# Patient Record
Sex: Female | Born: 1975 | Race: White | Hispanic: No | Marital: Married | Smoking: Current every day smoker
Health system: Southern US, Community
[De-identification: ages and names within clinical notes are randomized; demographics above are authoritative.]

---

## 2016-12-15 ENCOUNTER — Emergency Department (HOSPITAL_BASED_OUTPATIENT_CLINIC_OR_DEPARTMENT_OTHER)
Admission: EM | Admit: 2016-12-15 | Discharge: 2016-12-15 | Disposition: A | Discharge status: Home / Self Care | Payer: BLUE CROSS/BLUE SHIELD | Attending: Emergency Medicine | Admitting: Emergency Medicine

## 2016-12-15 ENCOUNTER — Emergency Department (HOSPITAL_BASED_OUTPATIENT_CLINIC_OR_DEPARTMENT_OTHER): Payer: BLUE CROSS/BLUE SHIELD

## 2016-12-15 ENCOUNTER — Encounter (HOSPITAL_BASED_OUTPATIENT_CLINIC_OR_DEPARTMENT_OTHER): Payer: Self-pay | Admitting: *Deleted

## 2016-12-15 ENCOUNTER — Encounter (HOSPITAL_BASED_OUTPATIENT_CLINIC_OR_DEPARTMENT_OTHER): Payer: Self-pay

## 2016-12-15 ENCOUNTER — Emergency Department (HOSPITAL_BASED_OUTPATIENT_CLINIC_OR_DEPARTMENT_OTHER)
Admission: EM | Admit: 2016-12-15 | Discharge: 2016-12-15 | Disposition: A | Discharge status: Home / Self Care | Payer: BLUE CROSS/BLUE SHIELD | Source: Home / Self Care | Attending: Emergency Medicine | Admitting: Emergency Medicine

## 2016-12-15 DIAGNOSIS — Z87891 Personal history of nicotine dependence: Secondary | ICD-10-CM | POA: Insufficient documentation

## 2016-12-15 DIAGNOSIS — R112 Nausea with vomiting, unspecified: Secondary | ICD-10-CM

## 2016-12-15 DIAGNOSIS — R1012 Left upper quadrant pain: Secondary | ICD-10-CM | POA: Insufficient documentation

## 2016-12-15 DIAGNOSIS — R1032 Left lower quadrant pain: Secondary | ICD-10-CM

## 2016-12-15 DIAGNOSIS — F17200 Nicotine dependence, unspecified, uncomplicated: Secondary | ICD-10-CM | POA: Insufficient documentation

## 2016-12-15 DIAGNOSIS — R109 Unspecified abdominal pain: Secondary | ICD-10-CM

## 2016-12-15 LAB — URINALYSIS, ROUTINE W REFLEX MICROSCOPIC
BILIRUBIN URINE: NEGATIVE
Bilirubin Urine: NEGATIVE
GLUCOSE, UA: NEGATIVE mg/dL
Glucose, UA: NEGATIVE mg/dL
KETONES UR: 40 mg/dL — AB
Ketones, ur: 40 mg/dL — AB
Leukocytes, UA: NEGATIVE
Leukocytes, UA: NEGATIVE
NITRITE: NEGATIVE
Nitrite: NEGATIVE
PROTEIN: NEGATIVE mg/dL
Protein, ur: NEGATIVE mg/dL
SPECIFIC GRAVITY, URINE: 1.006 (ref 1.005–1.030)
Specific Gravity, Urine: 1.021 (ref 1.005–1.030)
pH: 6.5 (ref 5.0–8.0)
pH: 6.5 (ref 5.0–8.0)

## 2016-12-15 LAB — CBC WITH DIFFERENTIAL/PLATELET
BASOS PCT: 0 %
Basophils Absolute: 0 10*3/uL (ref 0.0–0.1)
Basophils Absolute: 0.1 10*3/uL (ref 0.0–0.1)
Basophils Relative: 1 %
EOS ABS: 0.1 10*3/uL (ref 0.0–0.7)
Eosinophils Absolute: 0 10*3/uL (ref 0.0–0.7)
Eosinophils Relative: 0 %
Eosinophils Relative: 1 %
HEMATOCRIT: 38.7 % (ref 36.0–46.0)
HEMATOCRIT: 43.2 % (ref 36.0–46.0)
HEMOGLOBIN: 13.7 g/dL (ref 12.0–15.0)
HEMOGLOBIN: 15.2 g/dL — AB (ref 12.0–15.0)
LYMPHS ABS: 2.5 10*3/uL (ref 0.7–4.0)
LYMPHS ABS: 3.2 10*3/uL (ref 0.7–4.0)
LYMPHS PCT: 19 %
LYMPHS PCT: 29 %
MCH: 33.3 pg (ref 26.0–34.0)
MCH: 33.3 pg (ref 26.0–34.0)
MCHC: 35.2 g/dL (ref 30.0–36.0)
MCHC: 35.4 g/dL (ref 30.0–36.0)
MCV: 93.9 fL (ref 78.0–100.0)
MCV: 94.5 fL (ref 78.0–100.0)
MONOS PCT: 9 %
MONOS PCT: 9 %
Monocytes Absolute: 1 10*3/uL (ref 0.1–1.0)
Monocytes Absolute: 1.2 10*3/uL — ABNORMAL HIGH (ref 0.1–1.0)
NEUTROS PCT: 60 %
Neutro Abs: 6.6 10*3/uL (ref 1.7–7.7)
Neutro Abs: 9.5 10*3/uL — ABNORMAL HIGH (ref 1.7–7.7)
Neutrophils Relative %: 72 %
Platelets: 329 10*3/uL (ref 150–400)
Platelets: 367 10*3/uL (ref 150–400)
RBC: 4.12 MIL/uL (ref 3.87–5.11)
RBC: 4.57 MIL/uL (ref 3.87–5.11)
RDW: 11.6 % (ref 11.5–15.5)
RDW: 11.8 % (ref 11.5–15.5)
WBC: 10.9 10*3/uL — ABNORMAL HIGH (ref 4.0–10.5)
WBC: 13.4 10*3/uL — ABNORMAL HIGH (ref 4.0–10.5)

## 2016-12-15 LAB — COMPREHENSIVE METABOLIC PANEL
ALBUMIN: 4.4 g/dL (ref 3.5–5.0)
ALBUMIN: 4.8 g/dL (ref 3.5–5.0)
ALK PHOS: 77 U/L (ref 38–126)
ALK PHOS: 91 U/L (ref 38–126)
ALT: 20 U/L (ref 14–54)
ALT: 23 U/L (ref 14–54)
ANION GAP: 13 (ref 5–15)
ANION GAP: 14 (ref 5–15)
AST: 22 U/L (ref 15–41)
AST: 25 U/L (ref 15–41)
BILIRUBIN TOTAL: 1.1 mg/dL (ref 0.3–1.2)
BUN: 12 mg/dL (ref 6–20)
BUN: 8 mg/dL (ref 6–20)
CALCIUM: 9.2 mg/dL (ref 8.9–10.3)
CALCIUM: 9.9 mg/dL (ref 8.9–10.3)
CHLORIDE: 101 mmol/L (ref 101–111)
CO2: 21 mmol/L — AB (ref 22–32)
CO2: 24 mmol/L (ref 22–32)
Chloride: 99 mmol/L — ABNORMAL LOW (ref 101–111)
Creatinine, Ser: 0.71 mg/dL (ref 0.44–1.00)
Creatinine, Ser: 0.77 mg/dL (ref 0.44–1.00)
GFR calc Af Amer: >60 mL/min (ref >60)
GFR calc Af Amer: >60 mL/min (ref >60)
GFR calc non Af Amer: >60 mL/min (ref >60)
GFR calc non Af Amer: >60 mL/min (ref >60)
GLUCOSE: 128 mg/dL — AB (ref 65–99)
GLUCOSE: 95 mg/dL (ref 65–99)
Potassium: 3.2 mmol/L — ABNORMAL LOW (ref 3.5–5.1)
Potassium: 3.2 mmol/L — ABNORMAL LOW (ref 3.5–5.1)
SODIUM: 136 mmol/L (ref 135–145)
Sodium: 136 mmol/L (ref 135–145)
TOTAL PROTEIN: 8.6 g/dL — AB (ref 6.5–8.1)
Total Bilirubin: 1 mg/dL (ref 0.3–1.2)
Total Protein: 7.7 g/dL (ref 6.5–8.1)

## 2016-12-15 LAB — URINALYSIS, MICROSCOPIC (REFLEX)

## 2016-12-15 LAB — PREGNANCY, URINE: Preg Test, Ur: NEGATIVE

## 2016-12-15 LAB — LIPASE, BLOOD: Lipase: 33 U/L (ref 11–51)

## 2016-12-15 MED ORDER — ONDANSETRON HCL 4 MG/2ML IJ SOLN
4.0000 mg | Freq: Once | INTRAMUSCULAR | Status: AC
  Administered 2016-12-15: 4 mg via INTRAVENOUS
  Filled 2016-12-15: qty 2

## 2016-12-15 MED ORDER — SODIUM CHLORIDE 0.9 % IV BOLUS (SEPSIS)
1000.0000 mL | Freq: Once | INTRAVENOUS | Status: AC
  Administered 2016-12-15: 1000 mL via INTRAVENOUS

## 2016-12-15 MED ORDER — SODIUM CHLORIDE 0.9 % IV SOLN
INTRAVENOUS | Status: DC
  Administered 2016-12-15: 19:00:00 via INTRAVENOUS

## 2016-12-15 MED ORDER — ONDANSETRON 8 MG PO TBDP: 8.0000 mg | ORAL_TABLET | Freq: Three times a day (TID) | ORAL | 0 refills | Status: AC | PRN

## 2016-12-15 MED ORDER — ONDANSETRON 4 MG PO TBDP
ORAL_TABLET | ORAL | Status: AC
Start: 2016-12-15 — End: 2016-12-15
  Administered 2016-12-15: 4 mg via ORAL
  Filled 2016-12-15: qty 1

## 2016-12-15 MED ORDER — HYDROCODONE-ACETAMINOPHEN 5-325 MG PO TABS: 1.0000 | ORAL_TABLET | ORAL | 0 refills | Status: AC | PRN

## 2016-12-15 MED ORDER — FENTANYL CITRATE (PF) 100 MCG/2ML IJ SOLN
50.0000 ug | Freq: Once | INTRAMUSCULAR | Status: AC
  Administered 2016-12-15: 50 ug via INTRAVENOUS
  Filled 2016-12-15: qty 2

## 2016-12-15 MED ORDER — HYDROMORPHONE HCL 1 MG/ML IJ SOLN
0.5000 mg | INTRAMUSCULAR | Status: DC | PRN
  Administered 2016-12-15: 0.5 mg via INTRAVENOUS
  Filled 2016-12-15: qty 1

## 2016-12-15 MED ORDER — ONDANSETRON 4 MG PO TBDP
4.0000 mg | ORAL_TABLET | Freq: Once | ORAL | Status: AC
  Administered 2016-12-15: 4 mg via ORAL

## 2016-12-15 MED ORDER — DICYCLOMINE HCL 20 MG PO TABS: 20.0000 mg | ORAL_TABLET | Freq: Four times a day (QID) | ORAL | 0 refills | Status: AC | PRN

## 2016-12-15 NOTE — ED Provider Notes (Signed)
[facility] Provider Note   CSN: [id number] Arrival date & time: 12/15/16  1729     History   Chief Complaint Chief Complaint  Patient presents with  . Back Pain    HPI Malasha Kleppe is a 41 y.o. female.  HPI Patient presents to the emergency room for evaluation of persistent nausea, vomiting, dry heaves left flank pain. States his symptoms started several days ago where she was vomiting several times. Over the last couple of days she has not been vomiting but has been primarily dry heaving. She's had some pain in the left flank area. It seems to get better when her husband massage her back. She cannot find a comfortable position. She denies any trouble with shortness of breath. No coughing. When she gets the pain is intense and severe. Last night she was seen in the emergency room.  She had laboratory tests and a CT scan. She was released at about 2 AM. Patient was given prescriptions for anti-spasmodics and something for nausea. Patient states she has not been able to swallow a pill and has not taken anything for pain. She came into the emergency room today because she is having this persistent severe back pain. History reviewed. No pertinent past medical history.  There are no active problems to display for this patient.   History reviewed. No pertinent surgical history.  OB History    No data available       Home Medications    Prior to Admission medications   Medication Sig Start Date End Date Taking? Authorizing Provider  dicyclomine (BENTYL) 20 MG tablet Take 1 tablet (20 mg total) by mouth 4 (four) times daily as needed (for abdominal cramping). 12/15/16   Molpus, John, MD  HYDROcodone-acetaminophen (NORCO/VICODIN) 5-325 MG tablet Take 1 tablet by mouth every 4 (four) hours as needed. 12/15/16   Linwood Dibbles, MD  ondansetron (ZOFRAN ODT) 8 MG disintegrating tablet Take 1 tablet (8 mg total) by mouth every 8 (eight) hours as needed for nausea or vomiting. 12/15/16    Molpus, Jonny Ruiz, MD    Family History History reviewed. No pertinent family history.  Social History Social History  Substance Use Topics  . Smoking status: Current Every Day Smoker  . Smokeless tobacco: Never Used  . Alcohol use Yes     Allergies   Penicillins   Review of Systems Review of Systems  Constitutional: Negative for fever.  Cardiovascular: Negative for chest pain.  Genitourinary: Negative for dysuria.  All other systems reviewed and are negative.    Physical Exam Updated Vital Signs BP (!) 173/100 (BP Location: Left Arm)   Pulse 82   Temp 98.3 F (36.8 C) (Oral)   Resp 18   Ht 1.499 m (4\' 11" )   Wt 56.7 kg (125 lb)   LMP 12/10/2016 Comment: (-) u pregnancy (-)//a.c.  SpO2 100%   BMI 25.25 kg/m   Physical Exam  Constitutional: She appears well-developed and well-nourished. No distress.  HENT:  Head: Normocephalic and atraumatic.  Right Ear: External ear normal.  Left Ear: External ear normal.  Eyes: Conjunctivae are normal. Right eye exhibits no discharge. Left eye exhibits no discharge. No scleral icterus.  Neck: Neck supple. No tracheal deviation present.  Cardiovascular: Normal rate, regular rhythm and intact distal pulses.   Pulmonary/Chest: Effort normal and breath sounds normal. No stridor. No respiratory distress. She has no wheezes. She has no rales.  Abdominal: Soft. Bowel sounds are normal. She exhibits no distension and no mass.  There is no tenderness. There is no rebound and no guarding. No hernia.  No abdominal tenderness, no CVA tenderness  Musculoskeletal: She exhibits no edema or tenderness.  No spinal process edema or tenderness  Neurological: She is alert. She has normal strength. No cranial nerve deficit (no facial droop, extraocular movements intact, no slurred speech) or sensory deficit. She exhibits normal muscle tone. She displays no seizure activity. Coordination normal.  Skin: Skin is warm and dry. No rash noted.  No  vesicular rash noted in the flank area  Psychiatric: She has a normal mood and affect.  Nursing note and vitals reviewed.    ED Treatments / Results  Labs (all labs ordered are listed, but only abnormal results are displayed) Labs Reviewed  URINALYSIS, ROUTINE W REFLEX MICROSCOPIC - Abnormal; Notable for the following:       Result Value   Hgb urine dipstick SMALL (*)    Ketones, ur 40 (*)    All other components within normal limits  COMPREHENSIVE METABOLIC PANEL - Abnormal; Notable for the following:    Potassium 3.2 (*)    CO2 21 (*)    All other components within normal limits  CBC WITH DIFFERENTIAL/PLATELET - Abnormal; Notable for the following:    WBC 10.9 (*)    All other components within normal limits  URINALYSIS, MICROSCOPIC (REFLEX) - Abnormal; Notable for the following:    Bacteria, UA RARE (*)    Squamous Epithelial / LPF 0-5 (*)    All other components within normal limits  LIPASE, BLOOD    Radiology Ct Renal Stone Study  Result Date: 12/15/2016 CLINICAL DATA:  Left flank pain pain nausea, worsening for 4 days. None EXAM: CT ABDOMEN AND PELVIS WITHOUT CONTRAST TECHNIQUE: Multidetector CT imaging of the abdomen and pelvis was performed following the standard protocol without IV contrast. COMPARISON:  None. FINDINGS: Lower chest: No acute abnormality. Hepatobiliary: No focal liver abnormality is seen. No gallstones, gallbladder wall thickening, or biliary dilatation. Pancreas: Unremarkable. No pancreatic ductal dilatation or surrounding inflammatory changes. Spleen: Normal in size without focal abnormality. Adrenals/Urinary Tract: Adrenal glands are unremarkable. Kidneys are normal, without renal calculi, focal lesion, or hydronephrosis. Bladder is unremarkable. Stomach/Bowel: Stomach is within normal limits. Appendix is normal. No evidence of bowel wall thickening, distention, or inflammatory changes. Vascular/Lymphatic: No significant vascular findings are present. No  enlarged abdominal or pelvic lymph nodes. Reproductive: Uterus and bilateral adnexa are unremarkable. Other: No focal inflammation. No ascites. Tiny fat containing umbilical hernia. Musculoskeletal: No significant skeletal lesion. IMPRESSION: No significant abnormality. Electronically Signed   By: Ellery Plunkaniel R Mitchell M.D.   On: 12/15/2016 02:26    Procedures Procedures (including critical care time)  Medications Ordered in ED Medications  sodium chloride 0.9 % bolus 1,000 mL (0 mLs Intravenous Stopped 12/15/16 1913)    And  0.9 %  sodium chloride infusion ( Intravenous New Bag/Given 12/15/16 1913)  HYDROmorphone (DILAUDID) injection 0.5 mg (0.5 mg Intravenous Given 12/15/16 1818)  ondansetron (ZOFRAN) injection 4 mg (4 mg Intravenous Given 12/15/16 1817)     Initial Impression / Assessment and Plan / ED Course  I have reviewed the triage vital signs and the nursing notes.  Pertinent labs & imaging results that were available during my care of the patient were reviewed by me and considered in my medical decision making (see chart for details).  Clinical Course as of Dec 15 1929  Fri Dec 15, 2016  1802 Urine preg earlier this am, negative  [JK]  1928 REviewed [place] Controlled substance database.  Negative  [JK]    Clinical Course User Index [JK] Linwood DibblesKnapp, Ronzell Laban, MD    Patient presented to the emergency room with complaints of persistent nausea vomiting and back pain.  Patient had a thorough evaluation in the emergency room early this morning. She does CT abdomen pelvis that was reassuring. Patient's laboratory tests are normal today. No focal findings on exam to suggest cellulitis or shingles. She does not have any focal tenderness in her abdomen at this point and no tenderness along the spinal column.  Patient improved with fluids and pain medications. I think it's reasonable have her continue outpatient management. Follow-up with her primary care doctor. Return for fever or worsening symptoms Final  Clinical Impressions(s) / ED Diagnoses   Final diagnoses:  Non-intractable vomiting with nausea, unspecified vomiting type  Flank pain    New Prescriptions New Prescriptions   HYDROCODONE-ACETAMINOPHEN (NORCO/VICODIN) 5-325 MG TABLET    Take 1 tablet by mouth every 4 (four) hours as needed.     Linwood DibblesKnapp, Romey Mathieson, MD 12/15/16 [phone]

## 2016-12-15 NOTE — ED Triage Notes (Signed)
Pt c/o increasing nausea and flank pain since Monday, the pain would come and go, today it has been constant with several episodes of vomiting due to pain, pt c/o generalized abdominal pain as well, flank pain is worse, is unable to sit still in triage and is sobbing from pain

## 2016-12-15 NOTE — ED Notes (Signed)
ED Provider at bedside. 

## 2016-12-15 NOTE — Discharge Instructions (Signed)
Follow-up with your primary doctor on Monday to make sure if symptoms haven't resolved. Monitor for fever or worsening symptoms. Take your antinausea medications that you were previously prescribed and take the pain medications as needed

## 2016-12-15 NOTE — ED Provider Notes (Signed)
[facility] Provider Note: Lowella Dell, MD, FACEP  CSN: [id number] MRN: [id number] ARRIVAL: 12/15/16 at 0002 ROOM: [facility]   CHIEF COMPLAINT  Flank Pain   HISTORY OF PRESENT ILLNESS  12/15/16 1:00 AM Vicki Richardson is a 41 y.o. female with a four-day history of left flank pain. The pain has been intermittent and usually worse at night. The pain often radiates to the left upper quadrant. There has been associated nausea and vomiting which has been fairly persistent. She has had little oral intake in the past 2 days. She has not had a fever or chills. She has not had diarrhea. When the pain hits she has difficulty finding a comfortable position. Nothing significantly improves the pain. She rated her pain as an 8 out of 10 on arrival but was given fentanyl and Zofran prior to my evaluation with control of the pain and nausea. She does not know if she has had hematuria or dysuria because she has had very little urine output recently.   History reviewed. No pertinent past medical history.  History reviewed. No pertinent surgical history.  No family history on file.  Social History  Substance Use Topics  . Smoking status: Current Every Day Smoker  . Smokeless tobacco: Never Used  . Alcohol use Yes    Prior to Admission medications   Not on File    Allergies Penicillins   REVIEW OF SYSTEMS  Negative except as noted here or in the History of Present Illness.   PHYSICAL EXAMINATION  Initial Vital Signs Blood pressure (!) 133/108, pulse 96, temperature 98.3 F (36.8 C), temperature source Oral, resp. rate 18, height 4\' 11"  (1.499 m), weight 56.7 kg (125 lb), last menstrual period 12/10/2016, SpO2 98 %.  Examination General: Well-developed, well-nourished female in no acute distress; appearance consistent with age of record HENT: normocephalic; atraumatic Eyes: pupils equal, round and reactive to light; extraocular muscles intact Neck: supple Heart: regular  rate and rhythm Lungs: clear to auscultation bilaterally Abdomen: soft; nondistended; nontender; no masses or hepatosplenomegaly; bowel sounds present GU: No CVA tenderness Extremities: No deformity; full range of motion; pulses normal Neurologic: Awake, alert and oriented; motor function intact in all extremities and symmetric; no facial droop Skin: Warm and dry Psychiatric: Normal mood and affect   RESULTS  Summary of this visit's results, reviewed by myself:   EKG Interpretation  Date/Time:    Ventricular Rate:    PR Interval:    QRS Duration:   QT Interval:    QTC Calculation:   R Axis:     Text Interpretation:        Laboratory Studies: Results for orders placed or performed during the hospital encounter of 12/15/16 (from the past 24 hour(s))  CBC with Differential     Status: Abnormal   Collection Time: 12/15/16 12:32 AM  Result Value Ref Range   WBC 13.4 (H) 4.0 - 10.5 K/uL   RBC 4.57 3.87 - 5.11 MIL/uL   Hemoglobin 15.2 (H) 12.0 - 15.0 g/dL   HCT 78.2 95.6 - 21.3 %   MCV 94.5 78.0 - 100.0 fL   MCH 33.3 26.0 - 34.0 pg   MCHC 35.2 30.0 - 36.0 g/dL   RDW 08.6 57.8 - 46.9 %   Platelets 367 150 - 400 K/uL   Neutrophils Relative % 72 %   Neutro Abs 9.5 (H) 1.7 - 7.7 K/uL   Lymphocytes Relative 19 %   Lymphs Abs 2.5 0.7 - 4.0 K/uL   Monocytes  Relative 9 %   Monocytes Absolute 1.2 (H) 0.1 - 1.0 K/uL   Eosinophils Relative 0 %   Eosinophils Absolute 0.0 0.0 - 0.7 K/uL   Basophils Relative 0 %   Basophils Absolute 0.0 0.0 - 0.1 K/uL  Comprehensive metabolic panel     Status: Abnormal   Collection Time: 12/15/16 12:32 AM  Result Value Ref Range   Sodium 136 135 - 145 mmol/L   Potassium 3.2 (L) 3.5 - 5.1 mmol/L   Chloride 99 (L) 101 - 111 mmol/L   CO2 24 22 - 32 mmol/L   Glucose, Bld 128 (H) 65 - 99 mg/dL   BUN 12 6 - 20 mg/dL   Creatinine, Ser 9.600.71 0.44 - 1.00 mg/dL   Calcium 9.9 8.9 - 45.410.3 mg/dL   Total Protein 8.6 (H) 6.5 - 8.1 g/dL   Albumin 4.8 3.5 - 5.0  g/dL   AST 25 15 - 41 U/L   ALT 20 14 - 54 U/L   Alkaline Phosphatase 91 38 - 126 U/L   Total Bilirubin 1.1 0.3 - 1.2 mg/dL   GFR calc non Af Amer >60 >60 mL/min   GFR calc Af Amer >60 >60 mL/min   Anion gap 13 5 - 15  Urinalysis, Routine w reflex microscopic     Status: Abnormal   Collection Time: 12/15/16  1:56 AM  Result Value Ref Range   Color, Urine YELLOW YELLOW   APPearance CLEAR CLEAR   Specific Gravity, Urine 1.021 1.005 - 1.030   pH 6.5 5.0 - 8.0   Glucose, UA NEGATIVE NEGATIVE mg/dL   Hgb urine dipstick SMALL (A) NEGATIVE   Bilirubin Urine NEGATIVE NEGATIVE   Ketones, ur 40 (A) NEGATIVE mg/dL   Protein, ur NEGATIVE NEGATIVE mg/dL   Nitrite NEGATIVE NEGATIVE   Leukocytes, UA NEGATIVE NEGATIVE  Pregnancy, urine     Status: None   Collection Time: 12/15/16  1:56 AM  Result Value Ref Range   Preg Test, Ur NEGATIVE NEGATIVE  Urinalysis, Microscopic (reflex)     Status: Abnormal   Collection Time: 12/15/16  1:56 AM  Result Value Ref Range   RBC / HPF 0-5 0 - 5 RBC/hpf   WBC, UA 0-5 0 - 5 WBC/hpf   Bacteria, UA FEW (A) NONE SEEN   Squamous Epithelial / LPF 0-5 (A) NONE SEEN   Mucous PRESENT    Imaging Studies: Ct Renal Stone Study  Result Date: 12/15/2016 CLINICAL DATA:  Left flank pain pain nausea, worsening for 4 days. None EXAM: CT ABDOMEN AND PELVIS WITHOUT CONTRAST TECHNIQUE: Multidetector CT imaging of the abdomen and pelvis was performed following the standard protocol without IV contrast. COMPARISON:  None. FINDINGS: Lower chest: No acute abnormality. Hepatobiliary: No focal liver abnormality is seen. No gallstones, gallbladder wall thickening, or biliary dilatation. Pancreas: Unremarkable. No pancreatic ductal dilatation or surrounding inflammatory changes. Spleen: Normal in size without focal abnormality. Adrenals/Urinary Tract: Adrenal glands are unremarkable. Kidneys are normal, without renal calculi, focal lesion, or hydronephrosis. Bladder is unremarkable.  Stomach/Bowel: Stomach is within normal limits. Appendix is normal. No evidence of bowel wall thickening, distention, or inflammatory changes. Vascular/Lymphatic: No significant vascular findings are present. No enlarged abdominal or pelvic lymph nodes. Reproductive: Uterus and bilateral adnexa are unremarkable. Other: No focal inflammation. No ascites. Tiny fat containing umbilical hernia. Musculoskeletal: No significant skeletal lesion. IMPRESSION: No significant abnormality. Electronically Signed   By: Ellery Plunkaniel R Mitchell M.D.   On: 12/15/2016 02:26    ED COURSE  Nursing notes  and initial vitals signs, including pulse oximetry, reviewed.  Vitals:   12/15/16 0009 12/15/16 0057  BP: (!) 133/108   Pulse: (!) 133 96  Resp: 20 18  Temp: 98.3 F (36.8 C)   TempSrc: Oral   SpO2: 98% 98%  Weight: 56.7 kg (125 lb)   Height: 4\' 11"  (1.499 m)    2:34 AM Patient advised of reassuring lab and CT findings. She states her nausea and pain are returning. Will repeat her medications.  3:45 AM Patient feeling better. Patient would like to try going home. We will prescribe Zofran and Bentyl for her symptoms. I suspect her symptoms are likely due to an acute gastrointestinal virus.  PROCEDURES    ED DIAGNOSES     ICD-10-CM   1. Nausea and vomiting in adult R11.2   2. Left upper quadrant pain R10.12        Vrishank Moster, Jonny RuizJohn, MD 12/15/16 [phone]

## 2016-12-15 NOTE — ED Triage Notes (Signed)
Pt was seen in the ED last night for back pain.  States that the pain continues.  Pt tearful in triage.

## 2016-12-17 ENCOUNTER — Encounter (HOSPITAL_BASED_OUTPATIENT_CLINIC_OR_DEPARTMENT_OTHER): Payer: Self-pay | Admitting: Emergency Medicine

## 2016-12-17 ENCOUNTER — Emergency Department (HOSPITAL_BASED_OUTPATIENT_CLINIC_OR_DEPARTMENT_OTHER)
Admission: EM | Admit: 2016-12-17 | Discharge: 2016-12-17 | Disposition: A | Discharge status: Home / Self Care | Payer: BLUE CROSS/BLUE SHIELD | Attending: Emergency Medicine | Admitting: Emergency Medicine

## 2016-12-17 ENCOUNTER — Emergency Department (HOSPITAL_BASED_OUTPATIENT_CLINIC_OR_DEPARTMENT_OTHER): Payer: BLUE CROSS/BLUE SHIELD

## 2016-12-17 DIAGNOSIS — R1010 Upper abdominal pain, unspecified: Secondary | ICD-10-CM | POA: Insufficient documentation

## 2016-12-17 DIAGNOSIS — K8020 Calculus of gallbladder without cholecystitis without obstruction: Secondary | ICD-10-CM | POA: Diagnosis not present

## 2016-12-17 DIAGNOSIS — F17200 Nicotine dependence, unspecified, uncomplicated: Secondary | ICD-10-CM | POA: Insufficient documentation

## 2016-12-17 DIAGNOSIS — R110 Nausea: Secondary | ICD-10-CM | POA: Insufficient documentation

## 2016-12-17 DIAGNOSIS — R109 Unspecified abdominal pain: Secondary | ICD-10-CM | POA: Diagnosis present

## 2016-12-17 DIAGNOSIS — K828 Other specified diseases of gallbladder: Secondary | ICD-10-CM | POA: Diagnosis not present

## 2016-12-17 DIAGNOSIS — K8050 Calculus of bile duct without cholangitis or cholecystitis without obstruction: Secondary | ICD-10-CM

## 2016-12-17 LAB — COMPREHENSIVE METABOLIC PANEL
ALT: 43 U/L (ref 14–54)
AST: 35 U/L (ref 15–41)
Albumin: 4.4 g/dL (ref 3.5–5.0)
Alkaline Phosphatase: 81 U/L (ref 38–126)
Anion gap: 12 (ref 5–15)
BUN: 6 mg/dL (ref 6–20)
CHLORIDE: 96 mmol/L — AB (ref 101–111)
CO2: 24 mmol/L (ref 22–32)
Calcium: 9 mg/dL (ref 8.9–10.3)
Creatinine, Ser: 0.79 mg/dL (ref 0.44–1.00)
Glucose, Bld: 91 mg/dL (ref 65–99)
POTASSIUM: 3.2 mmol/L — AB (ref 3.5–5.1)
Sodium: 132 mmol/L — ABNORMAL LOW (ref 135–145)
Total Bilirubin: 1.1 mg/dL (ref 0.3–1.2)
Total Protein: 7.8 g/dL (ref 6.5–8.1)

## 2016-12-17 LAB — CBC
HEMATOCRIT: 39.9 % (ref 36.0–46.0)
Hemoglobin: 14.4 g/dL (ref 12.0–15.0)
MCH: 33.5 pg (ref 26.0–34.0)
MCHC: 36.1 g/dL — ABNORMAL HIGH (ref 30.0–36.0)
MCV: 92.8 fL (ref 78.0–100.0)
PLATELETS: 322 10*3/uL (ref 150–400)
RBC: 4.3 MIL/uL (ref 3.87–5.11)
RDW: 11.6 % (ref 11.5–15.5)
WBC: 8.2 10*3/uL (ref 4.0–10.5)

## 2016-12-17 LAB — LIPASE, BLOOD: Lipase: 42 U/L (ref 11–51)

## 2016-12-17 MED ORDER — HYDROCODONE-ACETAMINOPHEN 5-325 MG PO TABS: 1.0000 | ORAL_TABLET | Freq: Four times a day (QID) | ORAL | 0 refills | Status: AC | PRN

## 2016-12-17 MED ORDER — KETOROLAC TROMETHAMINE 30 MG/ML IJ SOLN
30.0000 mg | Freq: Once | INTRAMUSCULAR | Status: AC
  Administered 2016-12-17: 30 mg via INTRAVENOUS
  Filled 2016-12-17: qty 1

## 2016-12-17 MED ORDER — HYDROMORPHONE HCL 1 MG/ML IJ SOLN
0.5000 mg | Freq: Once | INTRAMUSCULAR | Status: AC
  Administered 2016-12-17: 0.5 mg via INTRAVENOUS
  Filled 2016-12-17: qty 1

## 2016-12-17 MED ORDER — SODIUM CHLORIDE 0.9 % IV BOLUS (SEPSIS)
1000.0000 mL | Freq: Once | INTRAVENOUS | Status: AC
  Administered 2016-12-17: 1000 mL via INTRAVENOUS

## 2016-12-17 MED ORDER — ONDANSETRON 8 MG PO TBDP: 8.0000 mg | ORAL_TABLET | Freq: Three times a day (TID) | ORAL | 0 refills | Status: AC | PRN

## 2016-12-17 MED ORDER — ONDANSETRON HCL 4 MG/2ML IJ SOLN
4.0000 mg | Freq: Once | INTRAMUSCULAR | Status: AC
  Administered 2016-12-17: 4 mg via INTRAVENOUS
  Filled 2016-12-17: qty 2

## 2016-12-17 NOTE — Discharge Instructions (Signed)
It was our pleasure to provide your ER care today - we hope that you feel better.  Rest. Drink plenty of fluids.  Take motrin or aleve as need for pain. You may also take hydrocodone as need for pain. No driving for the next 6 hours or when taking hydrocodone. Also, do not take tylenol or acetaminophen containing medication when taking hydrocodone.  Take zofran as need for nausea.  Follow up with general surgeon this week - call office tomorrow morning to arrange appointment.   Return to [facility] ER if worse, new symptoms, fevers, intractable pain, persistent vomiting, other concern.

## 2016-12-17 NOTE — ED Triage Notes (Signed)
Abd pain x5 days with nausea. Pt has been seen x 2 ref same. Pt states pain is ongoing.

## 2016-12-17 NOTE — ED Notes (Signed)
ED Provider at bedside. 

## 2016-12-17 NOTE — ED Provider Notes (Signed)
[facility] Provider Note   CSN: [id number] Arrival date & time: 12/17/16  4098     History   Chief Complaint Chief Complaint  Patient presents with  . Abdominal Pain    HPI Vicki Richardson is a 41 y.o. female.  Patient c/o upper abd pain for the past 4-5 days. Pain constant, dull, mod-severe, waxes and wanes in intensity. Prior to this, no similar pain. Nausea, no vomiting. No diarrhea. Pain radiates to mid back.  No hx gallstones, however both parents had cholecystectomy. Denies fever or chills. No chest pain or reflux symptoms/heartburn. No sob. No cough or uri c/o. No dysuria or hematuria. No mid to to lower abd pain. lnmp a week ago, no vaginal discharge or bleeding.    The history is provided by the patient.  Abdominal Pain   Associated symptoms include nausea. Pertinent negatives include fever, vomiting, dysuria and headaches.    History reviewed. No pertinent past medical history.  There are no active problems to display for this patient.   No past surgical history on file.  OB History    No data available       Home Medications    Prior to Admission medications   Medication Sig Start Date End Date Taking? Authorizing Provider  dicyclomine (BENTYL) 20 MG tablet Take 1 tablet (20 mg total) by mouth 4 (four) times daily as needed (for abdominal cramping). 12/15/16   Molpus, John, MD  HYDROcodone-acetaminophen (NORCO/VICODIN) 5-325 MG tablet Take 1 tablet by mouth every 4 (four) hours as needed. 12/15/16   Linwood Dibbles, MD  ondansetron (ZOFRAN ODT) 8 MG disintegrating tablet Take 1 tablet (8 mg total) by mouth every 8 (eight) hours as needed for nausea or vomiting. 12/15/16   Molpus, Jonny Ruiz, MD    Family History No family history on file.  Social History Social History  Substance Use Topics  . Smoking status: Current Every Day Smoker  . Smokeless tobacco: Never Used  . Alcohol use Yes     Allergies   Penicillins   Review of Systems Review of  Systems  Constitutional: Negative for chills and fever.  HENT: Negative for sore throat.   Eyes: Negative for redness.  Respiratory: Negative for cough and shortness of breath.   Cardiovascular: Negative for chest pain.  Gastrointestinal: Positive for abdominal pain and nausea. Negative for vomiting.  Genitourinary: Negative for dysuria, flank pain, vaginal bleeding and vaginal discharge.  Musculoskeletal: Negative for neck pain.  Skin: Negative for rash.  Neurological: Negative for headaches.  Hematological: Does not bruise/bleed easily.  Psychiatric/Behavioral: Negative for confusion.     Physical Exam Updated Vital Signs BP (!) 156/110 (BP Location: Left Arm)   Pulse 100   Temp 99 F (37.2 C) (Oral)   Resp 18   LMP 12/10/2016 Comment: (-) u pregnancy (-)//a.c.  SpO2 98%   Physical Exam  Constitutional: She appears well-developed and well-nourished. No distress.  HENT:  Mouth/Throat: Oropharynx is clear and moist.  Eyes: Conjunctivae are normal. No scleral icterus.  Neck: Neck supple. No tracheal deviation present.  Cardiovascular: Normal rate, regular rhythm, normal heart sounds and intact distal pulses.  Exam reveals no gallop and no friction rub.   No murmur heard. Pulmonary/Chest: Effort normal and breath sounds normal. No respiratory distress.  Abdominal: Soft. Normal appearance and bowel sounds are normal. She exhibits no distension and no mass. There is tenderness. There is no rebound and no guarding. No hernia.  Upper abd tenderness.   Genitourinary:  Genitourinary  Comments: No cva tenderness  Musculoskeletal: She exhibits no edema.  TL spine non tender, aligned.   Neurological: She is alert.  Skin: Skin is warm and dry. No rash noted. She is not diaphoretic.  Psychiatric: She has a normal mood and affect.  Nursing note and vitals reviewed.    ED Treatments / Results  Labs (all labs ordered are listed, but only abnormal results are displayed) Results for  orders placed or performed during the hospital encounter of 12/17/16  Comprehensive metabolic panel  Result Value Ref Range   Sodium 132 (L) 135 - 145 mmol/L   Potassium 3.2 (L) 3.5 - 5.1 mmol/L   Chloride 96 (L) 101 - 111 mmol/L   CO2 24 22 - 32 mmol/L   Glucose, Bld 91 65 - 99 mg/dL   BUN 6 6 - 20 mg/dL   Creatinine, Ser 1.610.79 0.44 - 1.00 mg/dL   Calcium 9.0 8.9 - 09.610.3 mg/dL   Total Protein 7.8 6.5 - 8.1 g/dL   Albumin 4.4 3.5 - 5.0 g/dL   AST 35 15 - 41 U/L   ALT 43 14 - 54 U/L   Alkaline Phosphatase 81 38 - 126 U/L   Total Bilirubin 1.1 0.3 - 1.2 mg/dL   GFR calc non Af Amer >60 >60 mL/min   GFR calc Af Amer >60 >60 mL/min   Anion gap 12 5 - 15  CBC  Result Value Ref Range   WBC 8.2 4.0 - 10.5 K/uL   RBC 4.30 3.87 - 5.11 MIL/uL   Hemoglobin 14.4 12.0 - 15.0 g/dL   HCT 04.539.9 40.936.0 - 81.146.0 %   MCV 92.8 78.0 - 100.0 fL   MCH 33.5 26.0 - 34.0 pg   MCHC 36.1 (H) 30.0 - 36.0 g/dL   RDW 91.411.6 78.211.5 - 95.615.5 %   Platelets 322 150 - 400 K/uL  Lipase, blood  Result Value Ref Range   Lipase 42 11 - 51 U/L   [place] Abdomen Complete  Result Date: 12/17/2016 CLINICAL DATA:  Severe upper abdominal pain with nausea EXAM: ABDOMEN ULTRASOUND COMPLETE COMPARISON:  12/15/2016 CT FINDINGS: Gallbladder: Sludge noted throughout the gallbladder. No visible shadowing stones. No wall thickening. Common bile duct: Diameter: Normal caliber, 3 mm Liver: No focal lesion identified. Within normal limits in parenchymal echogenicity. IVC: No abnormality visualized. Pancreas: Visualized portion unremarkable. Spleen: Size and appearance within normal limits. Right Kidney: Length: 11.6 cm. Echogenicity within normal limits. No mass or hydronephrosis visualized. Left Kidney: Length: 11.2 cm. Echogenicity within normal limits. No mass or hydronephrosis visualized. Abdominal aorta: No aneurysm visualized. Other findings: None. IMPRESSION: Large amount of sludge within the gallbladder. No visible stones or wall thickening.  Electronically Signed   By: Charlett NoseKevin  Dover M.D.   On: 12/17/2016 10:45   Ct Renal Stone Study  Result Date: 12/15/2016 CLINICAL DATA:  Left flank pain pain nausea, worsening for 4 days. None EXAM: CT ABDOMEN AND PELVIS WITHOUT CONTRAST TECHNIQUE: Multidetector CT imaging of the abdomen and pelvis was performed following the standard protocol without IV contrast. COMPARISON:  None. FINDINGS: Lower chest: No acute abnormality. Hepatobiliary: No focal liver abnormality is seen. No gallstones, gallbladder wall thickening, or biliary dilatation. Pancreas: Unremarkable. No pancreatic ductal dilatation or surrounding inflammatory changes. Spleen: Normal in size without focal abnormality. Adrenals/Urinary Tract: Adrenal glands are unremarkable. Kidneys are normal, without renal calculi, focal lesion, or hydronephrosis. Bladder is unremarkable. Stomach/Bowel: Stomach is within normal limits. Appendix is normal. No evidence of bowel wall thickening, distention, or  inflammatory changes. Vascular/Lymphatic: No significant vascular findings are present. No enlarged abdominal or pelvic lymph nodes. Reproductive: Uterus and bilateral adnexa are unremarkable. Other: No focal inflammation. No ascites. Tiny fat containing umbilical hernia. Musculoskeletal: No significant skeletal lesion. IMPRESSION: No significant abnormality. Electronically Signed   By: Ellery Plunkaniel R Mitchell M.D.   On: 12/15/2016 02:26    EKG  EKG Interpretation None       Radiology [place] Abdomen Complete  Result Date: 12/17/2016 CLINICAL DATA:  Severe upper abdominal pain with nausea EXAM: ABDOMEN ULTRASOUND COMPLETE COMPARISON:  12/15/2016 CT FINDINGS: Gallbladder: Sludge noted throughout the gallbladder. No visible shadowing stones. No wall thickening. Common bile duct: Diameter: Normal caliber, 3 mm Liver: No focal lesion identified. Within normal limits in parenchymal echogenicity. IVC: No abnormality visualized. Pancreas: Visualized portion  unremarkable. Spleen: Size and appearance within normal limits. Right Kidney: Length: 11.6 cm. Echogenicity within normal limits. No mass or hydronephrosis visualized. Left Kidney: Length: 11.2 cm. Echogenicity within normal limits. No mass or hydronephrosis visualized. Abdominal aorta: No aneurysm visualized. Other findings: None. IMPRESSION: Large amount of sludge within the gallbladder. No visible stones or wall thickening. Electronically Signed   By: Charlett NoseKevin  Dover M.D.   On: 12/17/2016 10:45    Procedures Procedures (including critical care time)  Medications Ordered in ED Medications - No data to display   Initial Impression / Assessment and Plan / ED Course  I have reviewed the triage vital signs and the nursing notes.  Pertinent labs & imaging results that were available during my care of the patient were reviewed by me and considered in my medical decision making (see chart for details).  Iv ns. Labs.  Ultrasound.   Reviewed nursing notes and prior charts for additional history.   Patient indicates pain currently controlled. No fever. No nv.   Discussed labs/us w pt. Will refer to gen surg f/u - they will call office in AM.  Final Clinical Impressions(s) / ED Diagnoses   Final diagnoses:  None    New Prescriptions New Prescriptions   No medications on file     Cathren LaineSteinl, Drea Jurewicz, MD 12/17/16 1117

## 2016-12-17 NOTE — ED Notes (Signed)
Pt reports taking 1 tab Vicodin around 0830; rates her pain as a 1 and describes it as a pressure in RUQ radiating to R mid-back.

## 2018-10-18 IMAGING — US US ABDOMEN COMPLETE
1 series · 14 of 25 positions shown · non-contrast
Comparison: 12/15/2016 CT

CLINICAL DATA: Severe upper abdominal pain with nausea

EXAM:
ABDOMEN ULTRASOUND COMPLETE

[Series 1: us abdomen complete · 0.20mm/px · 14 of 69 slices shown]
[im 1/69]
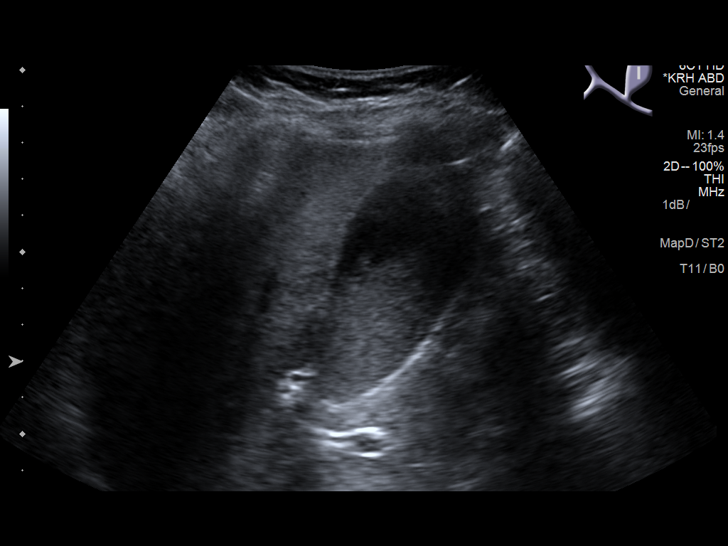
[im 6/69]
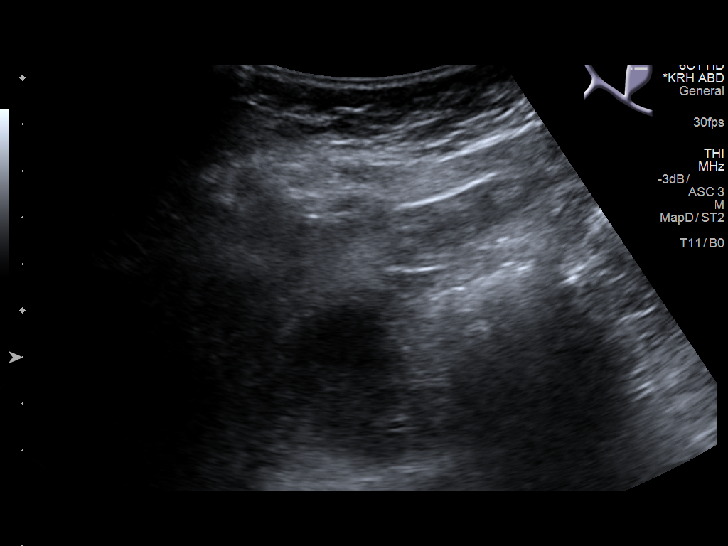
[im 12/69]
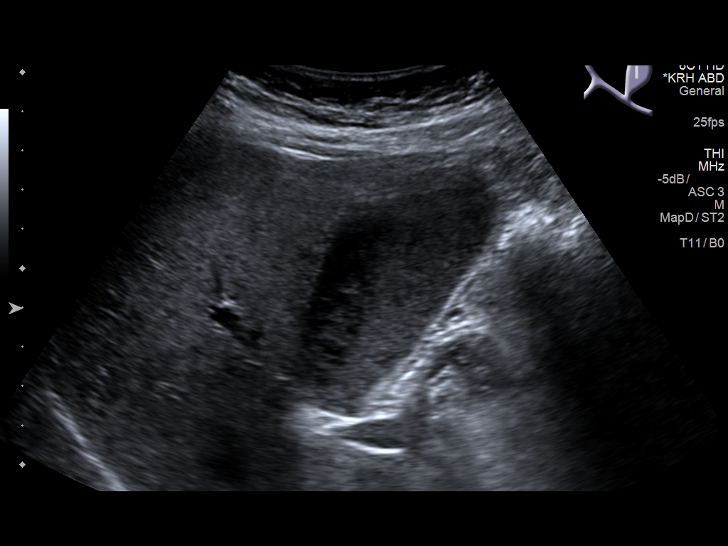
[im 18/69]
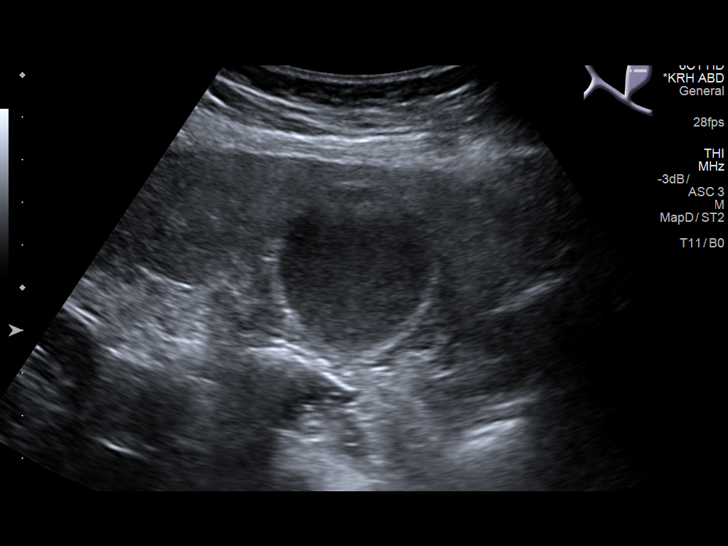
[im 23/69]
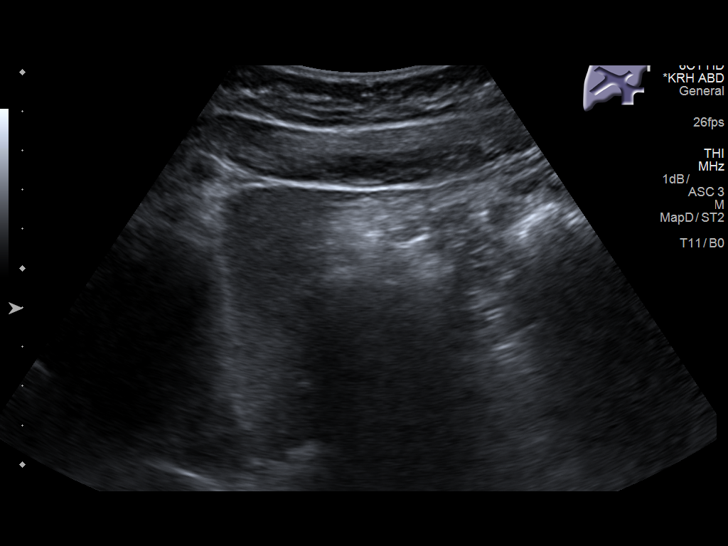
[im 26/69]
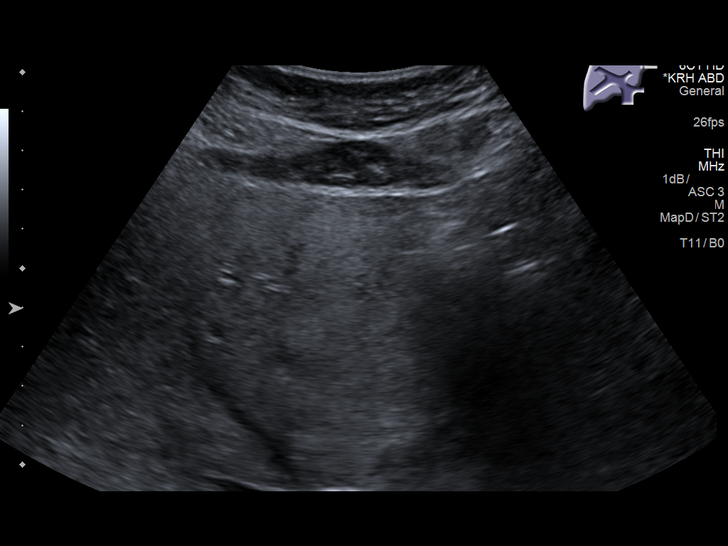
[im 32/69]
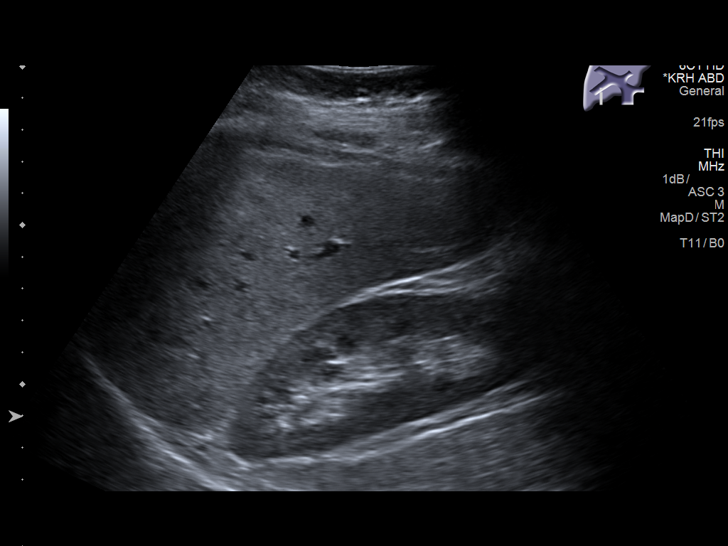
[im 37/69]
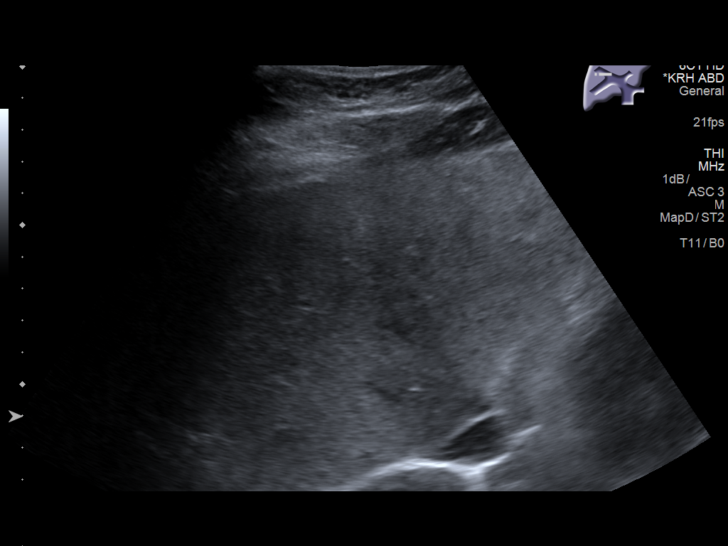
[im 43/69]
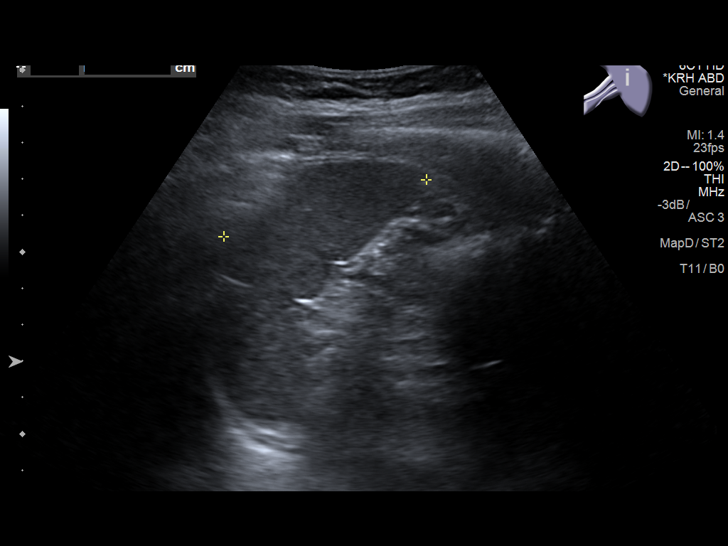
[im 46/69]
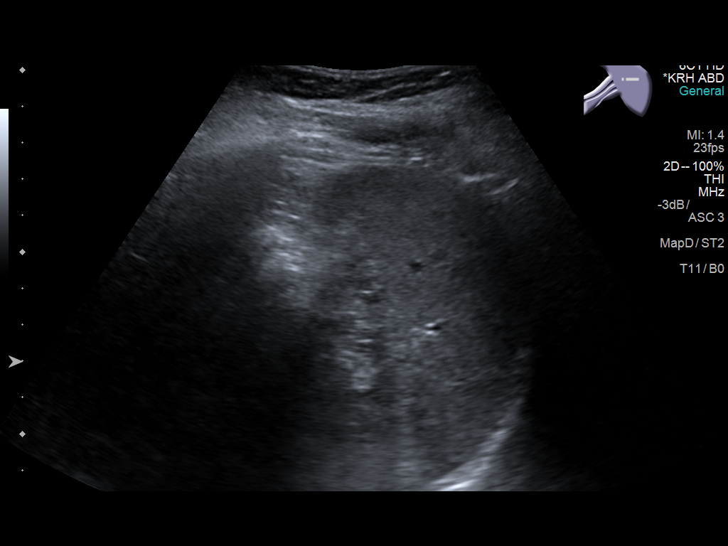
[im 52/69]
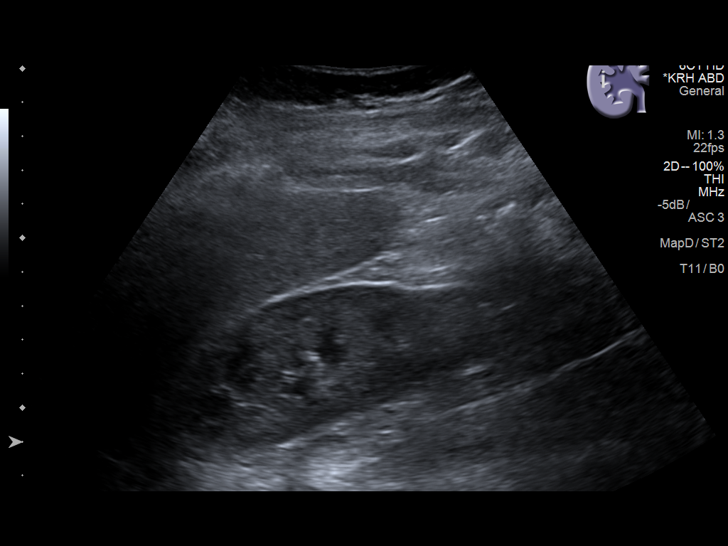
[im 57/69]
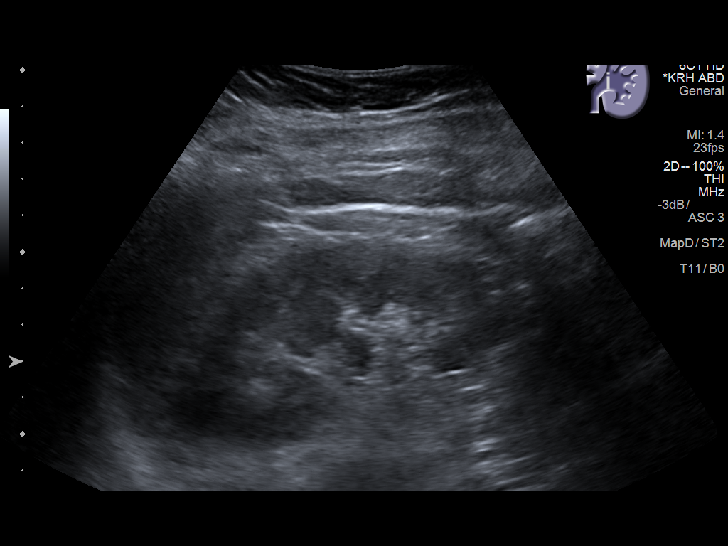
[im 63/69]
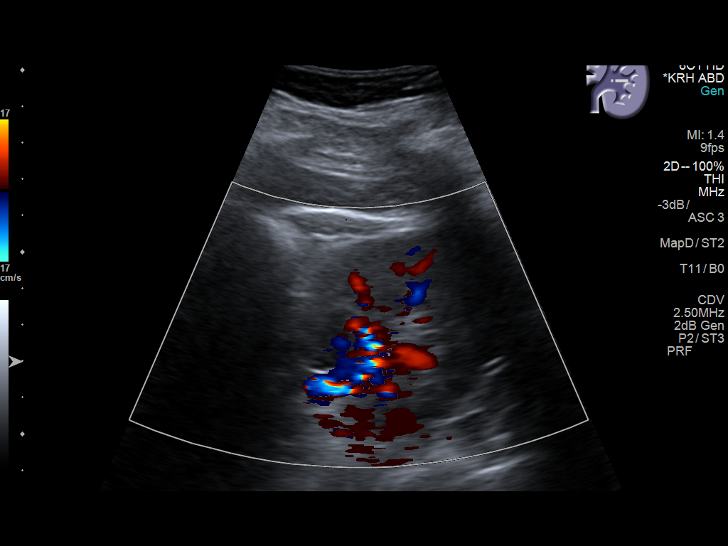
[im 69/69]
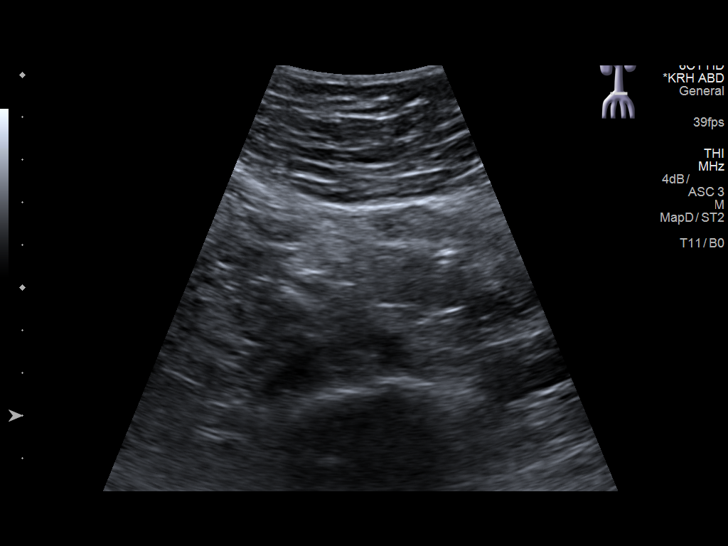

[14 of 25 positions shown; findings below may reference images not displayed]

FINDINGS: Gallbladder: Sludge noted throughout the gallbladder. No visible
shadowing stones. No wall thickening.

Common bile duct: Diameter: Normal caliber, 3 mm

Liver: No focal lesion identified. Within normal limits in
parenchymal echogenicity.

IVC: No abnormality visualized.

Pancreas: Visualized portion unremarkable.

Spleen: Size and appearance within normal limits.

Right Kidney: Length: 11.6 cm. Echogenicity within normal limits. No
mass or hydronephrosis visualized.

Left Kidney: Length: 11.2 cm. Echogenicity within normal limits. No
mass or hydronephrosis visualized.

Abdominal aorta: No aneurysm visualized.

Other findings: None.
IMPRESSION: Large amount of sludge within the gallbladder. No visible stones or
wall thickening.

## 2019-01-06 IMAGING — CT CT RENAL STONE PROTOCOL
2 of 4 series · 16 of 46 positions shown, 18 images · non-contrast
Comparison: None.

CLINICAL DATA: Left flank pain pain nausea, worsening for 4 days.
None

EXAM:
CT ABDOMEN AND PELVIS WITHOUT CONTRAST
TECHNIQUE: Multidetector CT imaging of the abdomen and pelvis was performed
following the standard protocol without IV contrast.

[Series 2: axial st · axial · 0.74mm/px · z∈[-443,-33]mm · 13 of 90 slices shown, 15 images]
[im 4/90  soft-tissue]
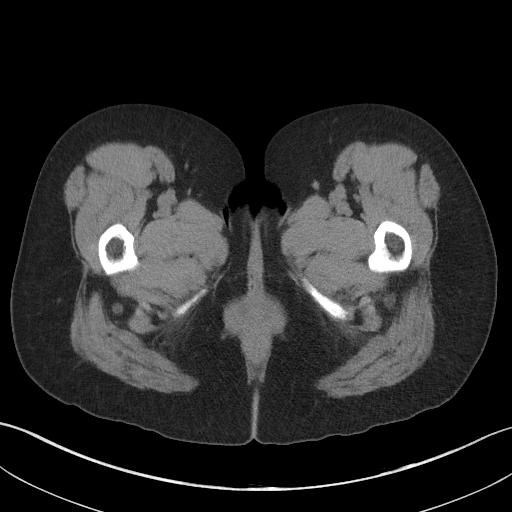
[im 4/90  bone]
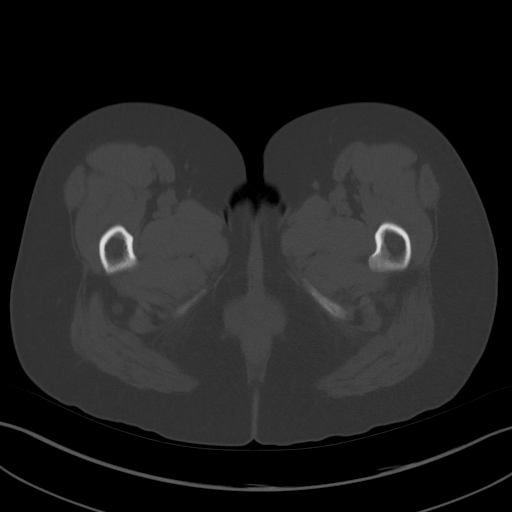
[im 12/90  soft-tissue]
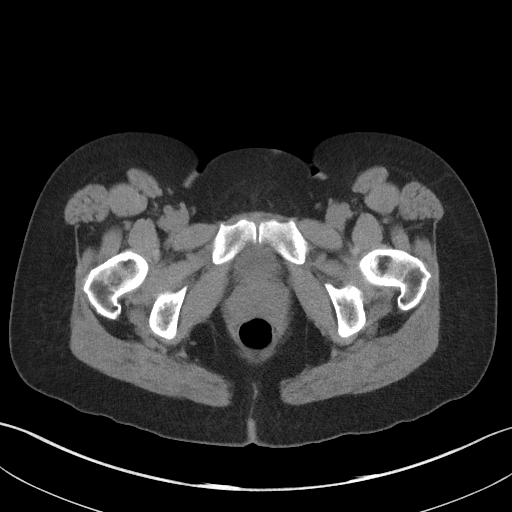
[im 20/90  soft-tissue]
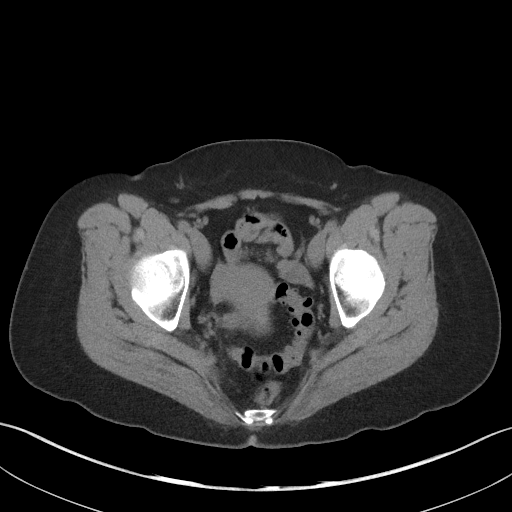
[im 24/90  soft-tissue]
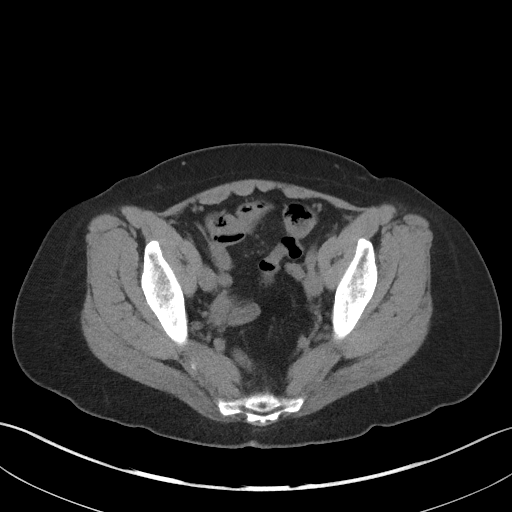
[im 31/90  soft-tissue]
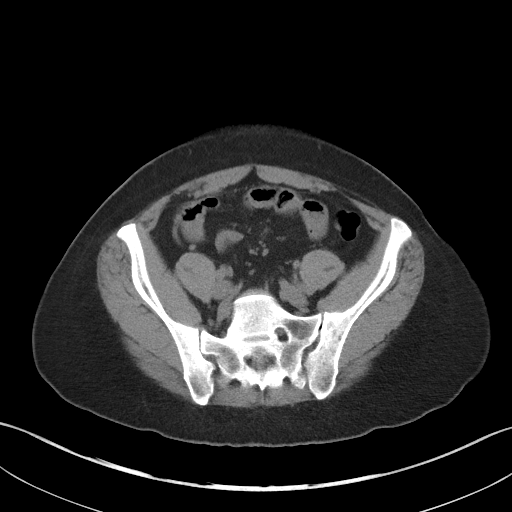
[im 39/90  soft-tissue]
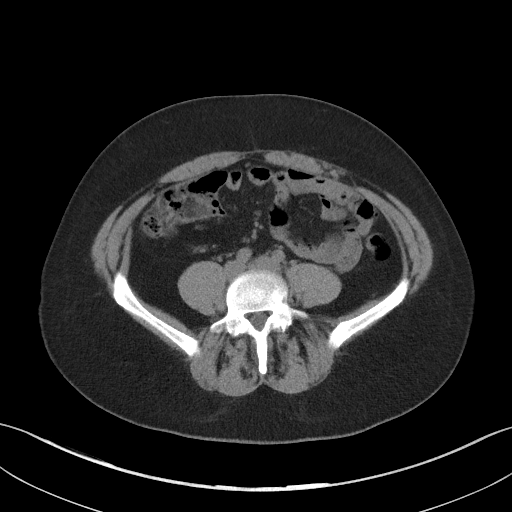
[im 47/90  soft-tissue]
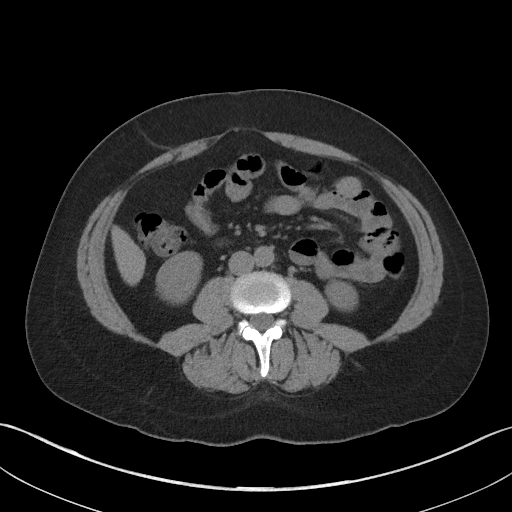
[im 51/90  soft-tissue]
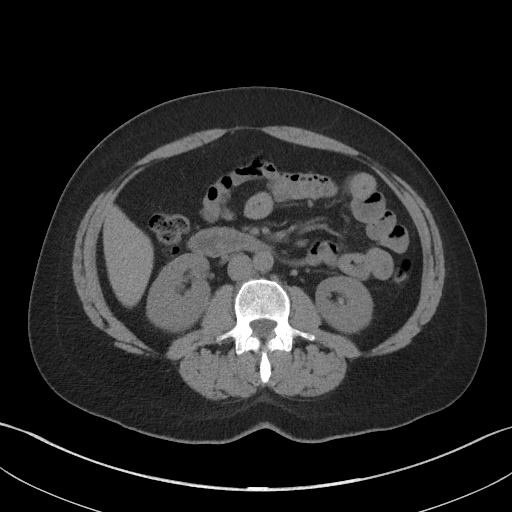
[im 59/90  soft-tissue]
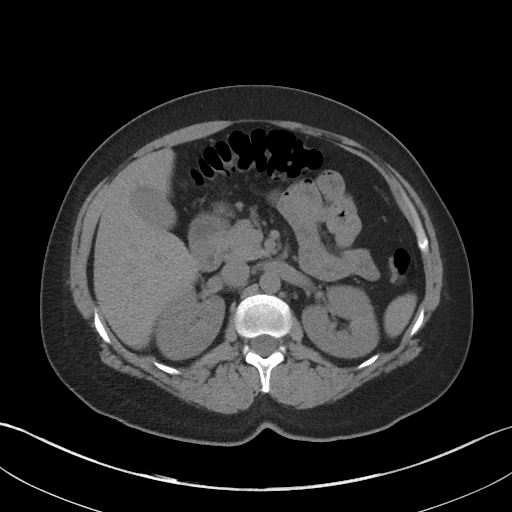
[im 59/90  bone]
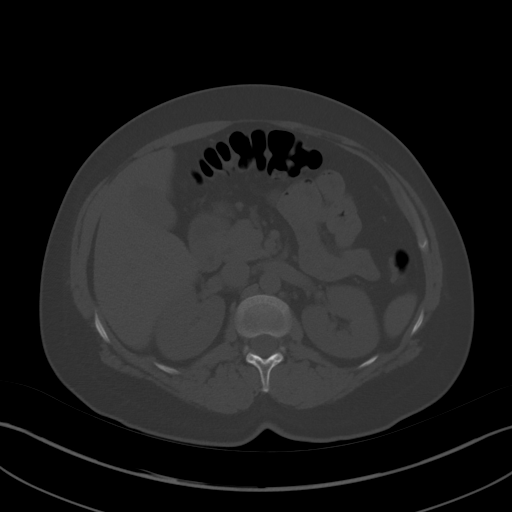
[im 66/90  soft-tissue]
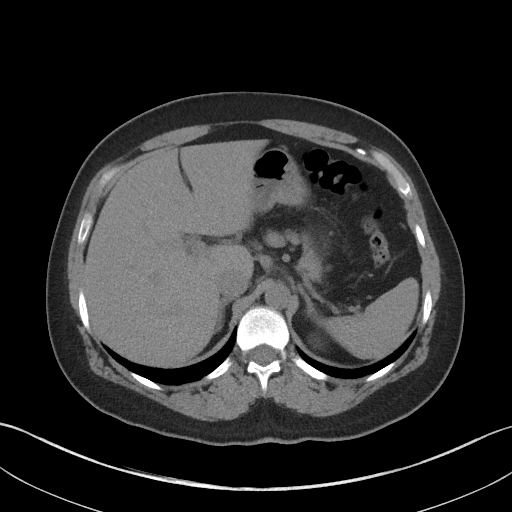
[im 70/90  soft-tissue]
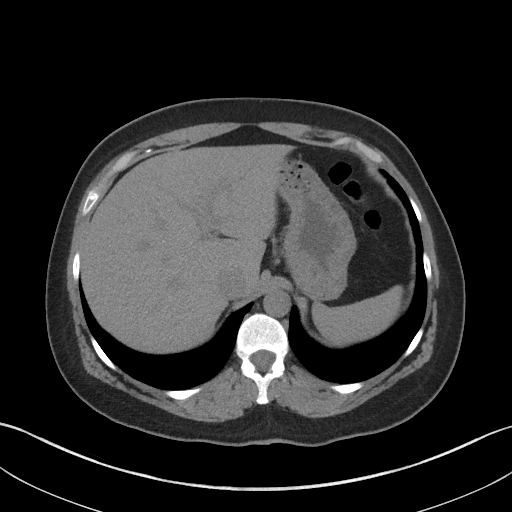
[im 78/90  soft-tissue]
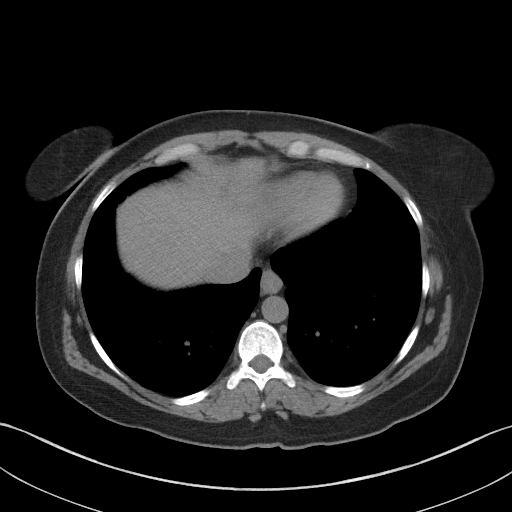
[im 86/90  soft-tissue]
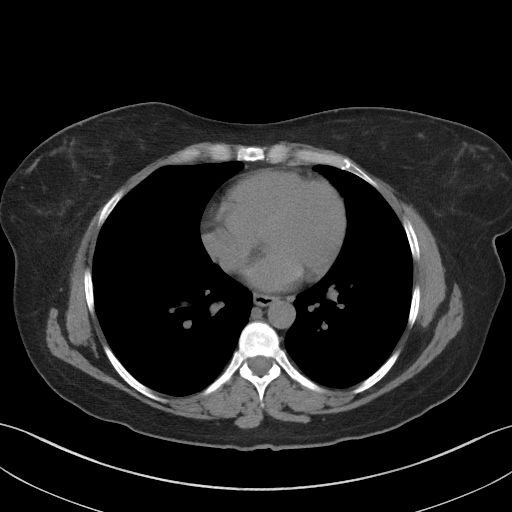

[Series 5: coronal st · coronal · 0.66mm/px · 3 of 77 slices shown]
[im 26/77  soft-tissue]
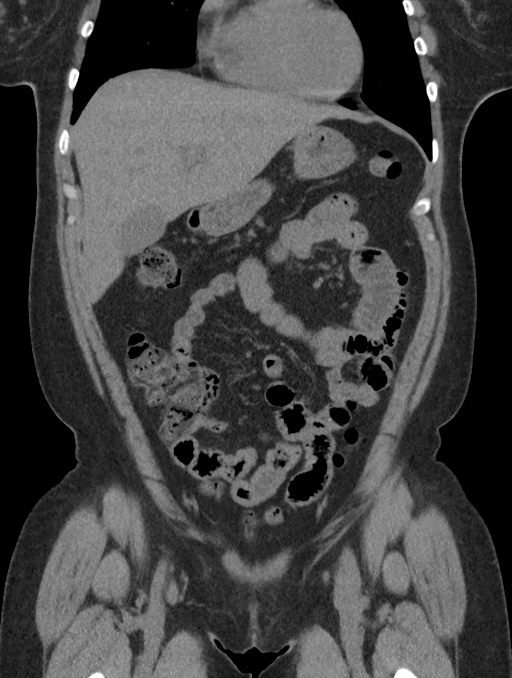
[im 34/77  soft-tissue]
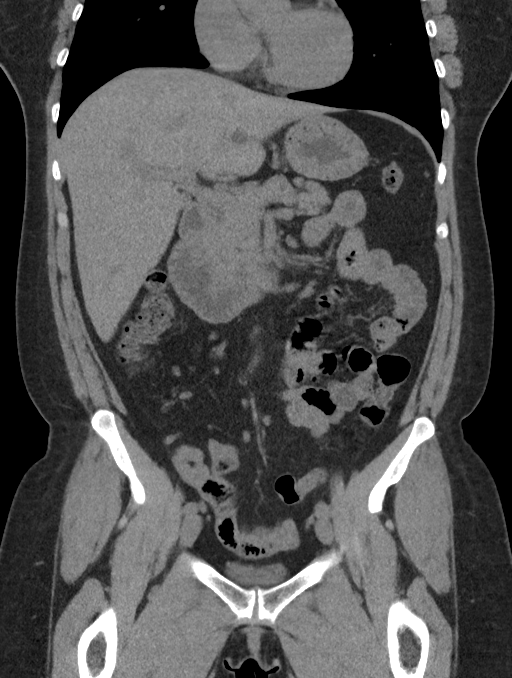
[im 43/77  soft-tissue]
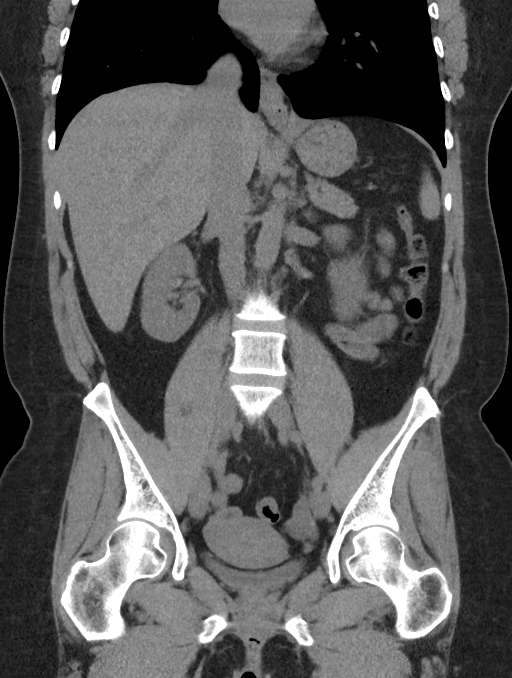

[16 of 46 positions shown; findings below may reference images not displayed]

FINDINGS: Lower chest: No acute abnormality.

Hepatobiliary: No focal liver abnormality is seen. No gallstones,
gallbladder wall thickening, or biliary dilatation.

Pancreas: Unremarkable. No pancreatic ductal dilatation or
surrounding inflammatory changes.

Spleen: Normal in size without focal abnormality.

Adrenals/Urinary Tract: Adrenal glands are unremarkable. Kidneys are
normal, without renal calculi, focal lesion, or hydronephrosis.
Bladder is unremarkable.

Stomach/Bowel: Stomach is within normal limits. Appendix is normal.
No evidence of bowel wall thickening, distention, or inflammatory
changes.

Vascular/Lymphatic: No significant vascular findings are present. No
enlarged abdominal or pelvic lymph nodes.

Reproductive: Uterus and bilateral adnexa are unremarkable.

Other: No focal inflammation. No ascites. Tiny fat containing
umbilical hernia.

Musculoskeletal: No significant skeletal lesion.
IMPRESSION: No significant abnormality.
